# Patient Record
Sex: Male | Born: 1965 | Race: White | Hispanic: No | Marital: Married | Smoking: Never smoker
Health system: Southern US, Community
[De-identification: ages and names within clinical notes are randomized; demographics above are authoritative.]

## PROBLEM LIST (undated history)

## (undated) DIAGNOSIS — I4891 Unspecified atrial fibrillation: Secondary | ICD-10-CM

## (undated) DIAGNOSIS — E7800 Pure hypercholesterolemia, unspecified: Secondary | ICD-10-CM

## (undated) DIAGNOSIS — E119 Type 2 diabetes mellitus without complications: Secondary | ICD-10-CM

## (undated) DIAGNOSIS — I10 Essential (primary) hypertension: Secondary | ICD-10-CM

## (undated) DIAGNOSIS — G4730 Sleep apnea, unspecified: Secondary | ICD-10-CM

## (undated) HISTORY — PX: HERNIA REPAIR: SHX51

---

## 2004-04-27 ENCOUNTER — Emergency Department: Payer: Self-pay | Admitting: Emergency Medicine

## 2004-11-20 ENCOUNTER — Ambulatory Visit: Payer: Self-pay | Admitting: Internal Medicine

## 2006-08-20 ENCOUNTER — Ambulatory Visit: Payer: Self-pay | Admitting: Surgery

## 2006-08-27 ENCOUNTER — Ambulatory Visit: Payer: Self-pay | Admitting: Surgery

## 2014-02-17 DIAGNOSIS — K120 Recurrent oral aphthae: Secondary | ICD-10-CM | POA: Insufficient documentation

## 2014-02-17 DIAGNOSIS — R2230 Localized swelling, mass and lump, unspecified upper limb: Secondary | ICD-10-CM | POA: Insufficient documentation

## 2014-02-17 DIAGNOSIS — J0100 Acute maxillary sinusitis, unspecified: Secondary | ICD-10-CM | POA: Insufficient documentation

## 2015-05-31 ENCOUNTER — Other Ambulatory Visit: Payer: Self-pay | Admitting: Surgery

## 2015-05-31 DIAGNOSIS — R2231 Localized swelling, mass and lump, right upper limb: Secondary | ICD-10-CM

## 2015-06-05 ENCOUNTER — Ambulatory Visit
Admission: RE | Admit: 2015-06-05 | Discharge: 2015-06-05 | Disposition: A | Discharge status: Home / Self Care | Payer: BC Managed Care – PPO | Source: Ambulatory Visit | Attending: Surgery | Admitting: Surgery

## 2015-06-05 DIAGNOSIS — R2231 Localized swelling, mass and lump, right upper limb: Secondary | ICD-10-CM | POA: Insufficient documentation

## 2016-01-30 DIAGNOSIS — Z6836 Body mass index (BMI) 36.0-36.9, adult: Secondary | ICD-10-CM | POA: Insufficient documentation

## 2016-01-30 DIAGNOSIS — E119 Type 2 diabetes mellitus without complications: Secondary | ICD-10-CM | POA: Insufficient documentation

## 2017-02-05 IMAGING — US US EXTREM UP *R* LTD
1 series · 12 of 12 positions shown · non-contrast
Comparison: None.

CLINICAL DATA: Right axillary mass for 8 years has slightly
enlarged recently. No known injury. Initial encounter.

EXAM:
ULTRASOUND RIGHT UPPER EXTREMITY LIMITED
TECHNIQUE: Ultrasound examination of the upper extremity soft tissues was
performed in the area of clinical concern.

[Series 1: us extrem up *right* ltd · 0.07mm/px · 12 of 12 slices shown]
[im 1/12]
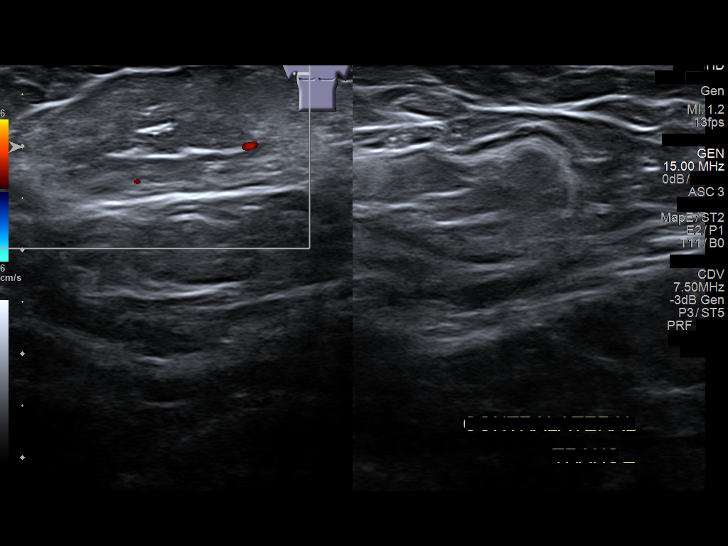
[im 2/12]
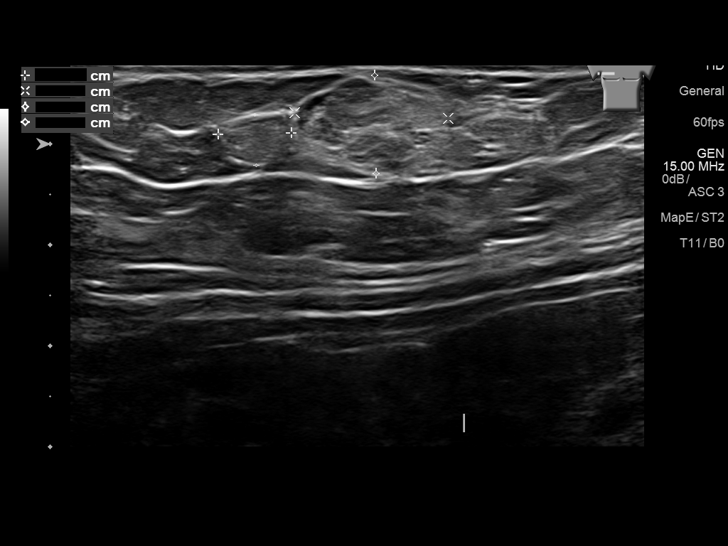
[im 3/12]
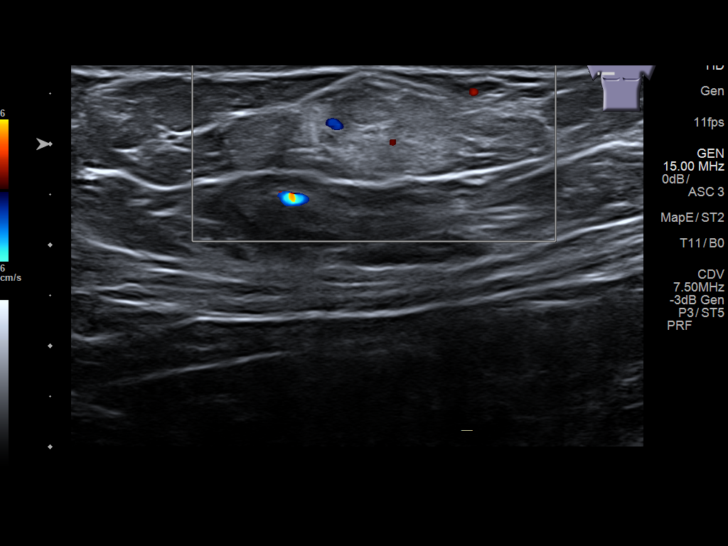
[im 4/12]
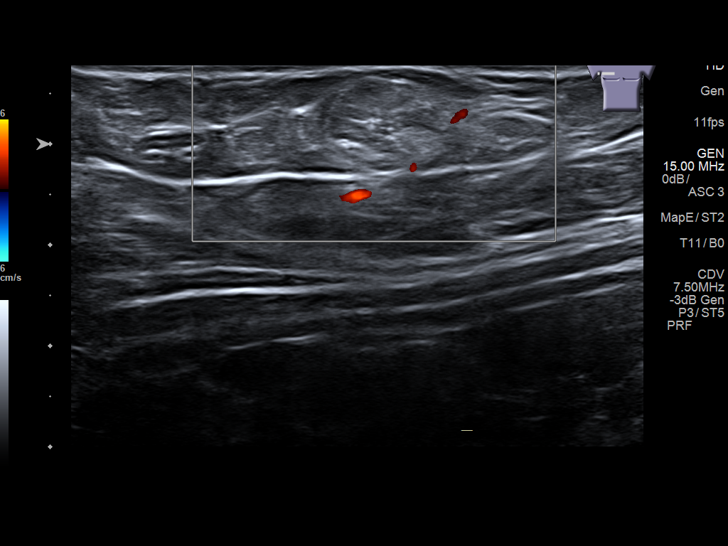
[im 5/12]
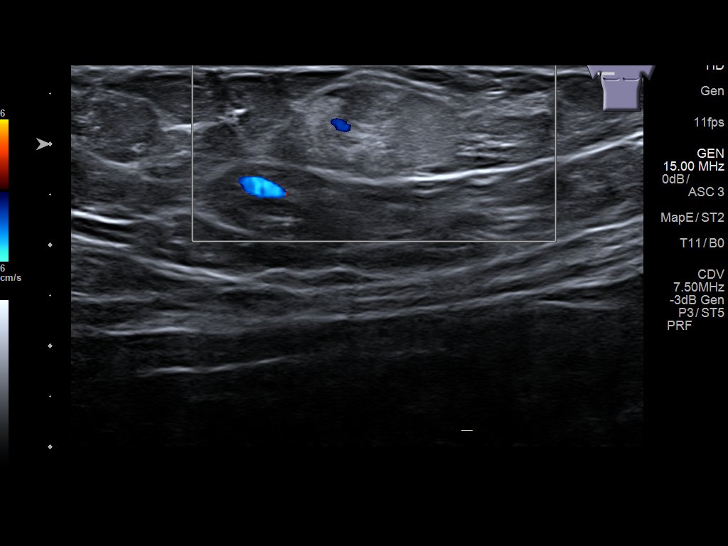
[im 6/12]
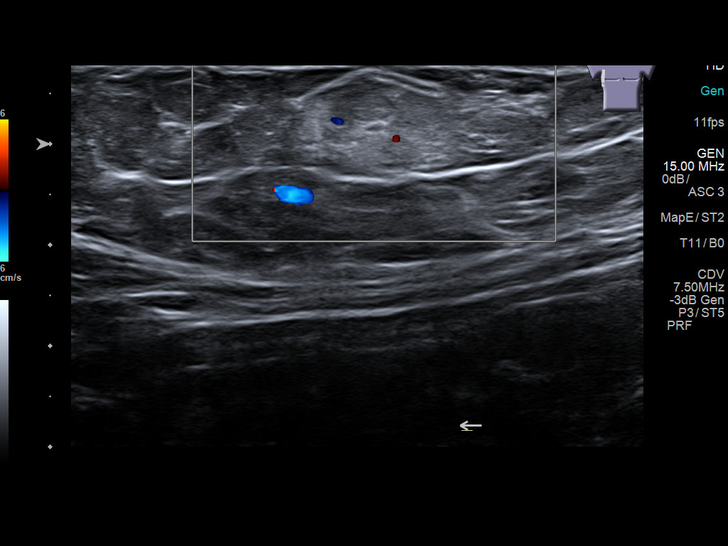
[im 7/12]
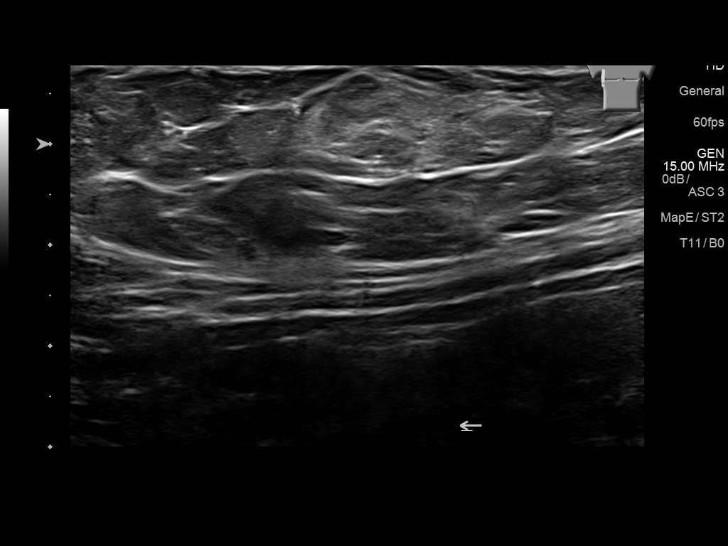
[im 8/12]
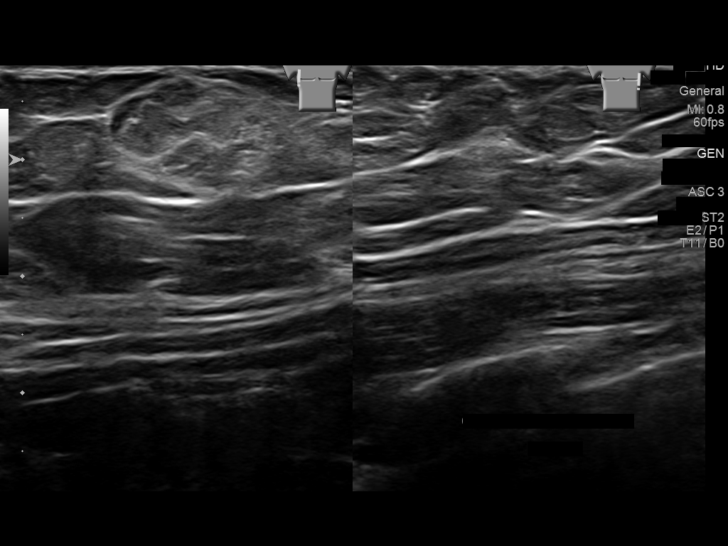
[im 9/12]
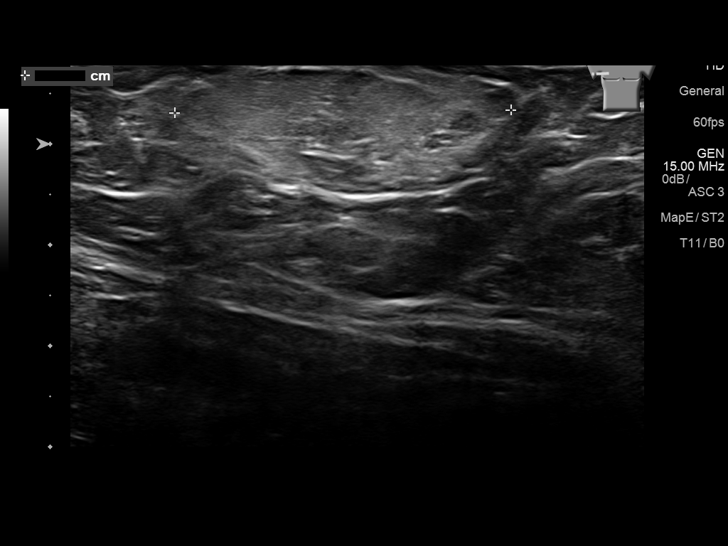
[im 10/12]
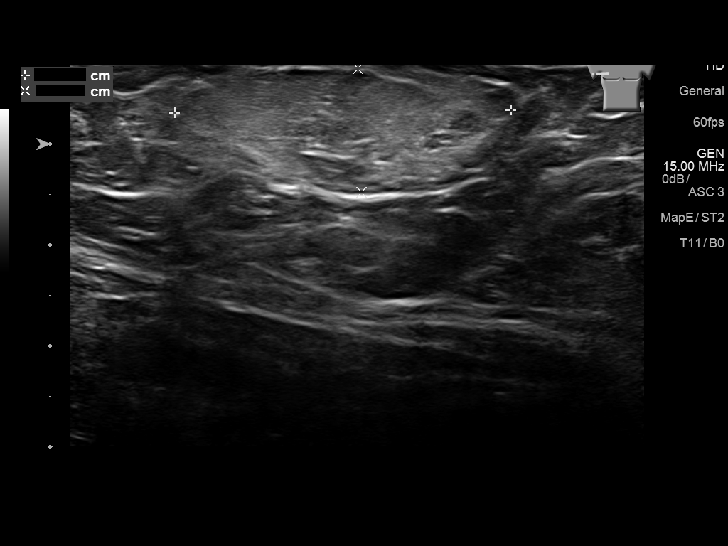
[im 11/12]
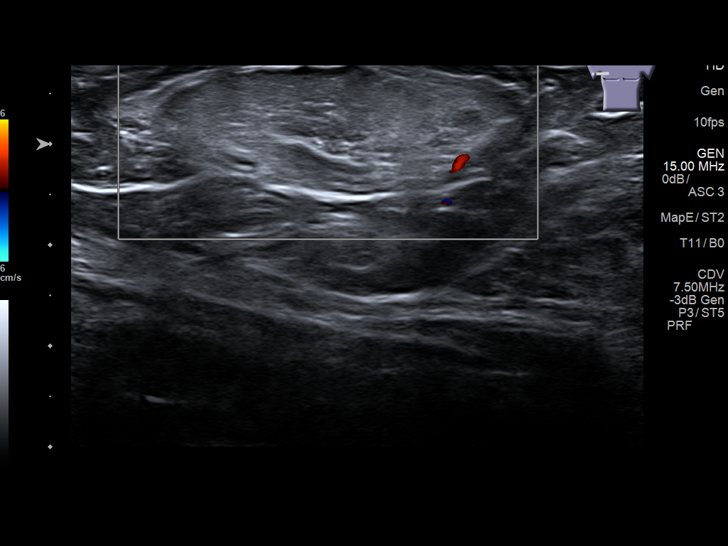
[im 12/12]
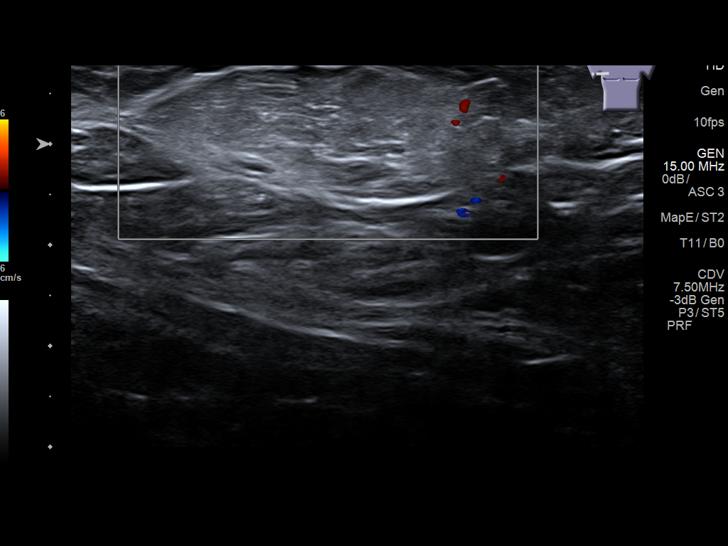

[12 of 12 positions shown; findings below may reference images not displayed]

FINDINGS: Imaging performed in the region of concern on the right axilla
demonstrates a mildly lobulated hyperechoic structure measuring in
total 3.3 x 1.2 x 2.3 cm. Echogenicity is similar to adjacent fat
lobules. No fluid collection is identified. There is some flow
within area of concern on Doppler imaging.
IMPRESSION: Findings most consistent with a simple lipoma in the region of
concern. If the area becomes painful or continues to enlarge, MR
imaging could be used for further evaluation.

## 2019-03-02 ENCOUNTER — Telehealth: Payer: Self-pay | Admitting: Nurse Practitioner

## 2019-03-02 ENCOUNTER — Encounter: Payer: Self-pay | Admitting: Emergency Medicine

## 2019-03-02 ENCOUNTER — Ambulatory Visit
Admission: EM | Admit: 2019-03-02 | Discharge: 2019-03-02 | Disposition: A | Discharge status: Home / Self Care | Payer: BC Managed Care – PPO | Attending: Urgent Care | Admitting: Urgent Care

## 2019-03-02 ENCOUNTER — Other Ambulatory Visit: Payer: Self-pay

## 2019-03-02 DIAGNOSIS — Z20822 Contact with and (suspected) exposure to covid-19: Secondary | ICD-10-CM | POA: Insufficient documentation

## 2019-03-02 DIAGNOSIS — Z7189 Other specified counseling: Secondary | ICD-10-CM | POA: Insufficient documentation

## 2019-03-02 DIAGNOSIS — U071 COVID-19: Secondary | ICD-10-CM

## 2019-03-02 HISTORY — DX: Pure hypercholesterolemia, unspecified: E78.00

## 2019-03-02 LAB — SARS CORONAVIRUS 2 AG (30 MIN TAT): SARS Coronavirus 2 Ag: POSITIVE — AB

## 2019-03-02 NOTE — Discharge Instructions (Addendum)
It was very nice seeing you today in clinic. Thank you for entrusting me with your care.   REST and Stay HYDRATED. Water and electrolyte containing beverages (Gatorade, Pedialyte) are best to prevent dehydration and electrolyte abnormalities.  Im referring you to the COVID infusion clinic. Someone should call you to discuss being treated with infusion to treat COVID.   Make arrangements to follow up with your regular doctor in 1 week for re-evaluation if not improving. If your symptoms/condition worsens, please seek follow up care either here or in the ER. Please remember, our [facility] providers are "right here with you" when you need [place].   Again, it was my pleasure to take care of you today. Thank you for choosing our clinic. I hope that you start to feel better quickly.   Quentin Mulling, MSN, APRN, FNP-C, CEN Advanced Practice Provider [facility]

## 2019-03-02 NOTE — ED Triage Notes (Signed)
Patient had positive exposure to COVID from his father. He is c/o headache, cough and nasal congestion x 5 days.

## 2019-03-02 NOTE — Telephone Encounter (Signed)
Called to Discuss with patient about Covid symptoms and the use of bamlanivimab, a monoclonal antibody infusion for those with mild to moderate Covid symptoms and at a high risk of hospitalization.     Pt is qualified for this infusion at the [facility] infusion center due to co-morbid conditions and/or a member of an at-risk group.    Patient states that he might get infusion through [facility]. He will call us back when he makes a decision.

## 2019-03-02 NOTE — ED Provider Notes (Signed)
[place], [place]   Name: Jason Soto DOB: [birth year] MRN: [id number] CSN: [id number] PCP: [facility]  Arrival date and time:  03/02/19 1027  Chief Complaint:  Cough, Nasal Congestion, and Headache   NOTE: Prior to seeing the patient today, I have reviewed the triage nursing documentation and vital signs. Clinical staff has updated patient's PMH/PSHx, current medication list, and drug allergies/intolerances to ensure comprehensive history available to assist in medical decision making.   History:   HPI: Jason Soto is a 54 y.o. male who presents today with complaints of fatigue, cough, sore throat, and a generalized headache that started approximately 5-6 days ago. Cough has been non-productive with no associated shortness of breath or wheezing. He denies that he has experienced any nausea, vomiting, diarrhea, or abdominal pain. He is eating and drinking well. Patient denies any perceived alterations to his sense of taste or smell. Patient presents out of concerns for his personal health after being exposed to someone who tested positive for SARS-CoV-2 (novel coronavirus) on 02/27/2019. Patient helps to provide primary care services for his elderly step-father who was admitted to [facility] yesterday with SARS-CoV-2. Patient presents with his elderly mother today who also has a similar symptom constellation. He has never been tested for SARS-CoV-2 (novel coronavirus) in the past per his report. Patient has been vaccinated for influenza this season. Despite his symptoms, patient has not taken any over the counter interventions to help improve/relieve his reported symptoms at home.    Past Medical History:  Diagnosis Date  . Hypercholesteremia     Past Surgical History:  Procedure Laterality Date  . HERNIA REPAIR      History reviewed. No pertinent family history.  Social History   Tobacco Use  . Smoking status: Never Smoker  . Smokeless tobacco: Never Used   Substance Use Topics  . Alcohol use: Never  . Drug use: Never    There are no problems to display for this patient.   Home Medications:    Current Meds  Medication Sig  . atorvastatin (LIPITOR) 80 MG tablet Take by mouth.  Marland Kitchen lisinopril (ZESTRIL) 2.5 MG tablet Take by mouth.  . metFORMIN (GLUCOPHAGE-XR) 500 MG 24 hr tablet Take by mouth.    Allergies:   Aspirin and Penicillins  Review of Systems (ROS): Review of Systems  Constitutional: Positive for fatigue. Negative for fever.  HENT: Positive for sore throat. Negative for congestion, ear pain, postnasal drip, rhinorrhea, sinus pressure, sinus pain and sneezing.   Eyes: Negative for pain, discharge and redness.  Respiratory: Positive for chest tightness. Negative for cough and shortness of breath.   Cardiovascular: Negative for chest pain and palpitations.  Gastrointestinal: Negative for abdominal pain, diarrhea, nausea and vomiting.  Musculoskeletal: Negative for arthralgias, back pain, myalgias and neck pain.  Skin: Negative for color change, pallor and rash.  Neurological: Positive for headaches. Negative for dizziness, syncope and weakness.  Hematological: Negative for adenopathy.     Vital Signs: Today's Vitals   03/02/19 1058 03/02/19 1059 03/02/19 1158  BP: (!) 138/94    Pulse: 86    Resp: 18    Temp: 98.6 F (37 C)    TempSrc: Oral    SpO2: 98%    Weight:  198 lb (89.8 kg)   Height:  _0  (1.727 m)   PainSc:  0-No pain 0-No pain    Physical Exam: Physical Exam  Constitutional: He is oriented to person, place, and time and well-developed, well-nourished, and  in no distress.  HENT:  Head: Normocephalic and atraumatic.  Nose: Nose normal.  Mouth/Throat: Uvula is midline and mucous membranes are normal. Posterior oropharyngeal erythema present. No oropharyngeal exudate or posterior oropharyngeal edema.  Eyes: Pupils are equal, round, and reactive to light.  Neck: No tracheal deviation present.    Cardiovascular: Normal rate, regular rhythm, normal heart sounds and intact distal pulses.  Pulmonary/Chest: Effort normal and breath sounds normal.  Musculoskeletal:     Cervical back: Normal range of motion and neck supple.  Lymphadenopathy:    He has no cervical adenopathy.  Neurological: He is alert and oriented to person, place, and time. Gait normal.  Skin: Skin is warm and dry. No rash noted. He is not diaphoretic.  Psychiatric: Mood, memory, affect and judgment normal.  Nursing note and vitals reviewed.   Urgent Care Treatments / Results:   Orders Placed This Encounter  Procedures  . SARS Coronavirus 2 Ag (30 min TAT) - Nasal Swab (BD Veritor Kit)  . Airborne and Contact precautions    LABS: PLEASE NOTE: all labs that were ordered this encounter are listed, however only abnormal results are displayed. Labs Reviewed  SARS CORONAVIRUS 2 AG (30 MIN TAT) - Abnormal; Notable for the following components:      Result Value   SARS Coronavirus 2 Ag POSITIVE (*)    All other components within normal limits    EKG: -None  RADIOLOGY: No results found.  PROCEDURES: Procedures  MEDICATIONS RECEIVED THIS VISIT: Medications - No data to display  PERTINENT CLINICAL COURSE NOTES/UPDATES:   Initial Impression / Assessment and Plan / Urgent Care Course:  Pertinent labs & imaging results that were available during my care of the patient were personally reviewed by me and considered in my medical decision making (see lab/imaging section of note for values and interpretations).  Jason Soto is a 54 y.o. male who presents to [facility] today with complaints of Cough, Nasal Congestion, and Headache  Patient overall well appearing and in no acute distress today in clinic. Presenting symptoms (see HPI) and exam as documented above. He presents with symptoms associated with SARS-CoV-2 (novel coronavirus) follow a direct exposure. Discussed typical symptom constellation.  Reviewed potential for infection and need for testing. Patient amenable to being tested.   Rapid SARS-CoV-2 Ag swab collected by certified clinical staff; results POSITIVE.   Discussed results with patient. Patient has multiple co-morbidities that increase his SARS-CoV-2 morbidity and mortality risk including HTN and T2DM. I discussed with him that his symptoms are viral in nature, thus antibiotics would not offer him any relief or improve his symptoms any faster than conservative symptomatic management.    In light of his positive results, his co-morbidities should qualify him for treatment with the monoclonal antibody (bamlanivimab) infusion. Referral made to the outpatient COVID response team to determine eligibility. Patient advised to expect a call from [facility] to discuss if he is deemed to be eligible for the infusion treatment.   In the interim, discussed supportive care measures at home during acute phase of illness. Patient to rest as much as possible. He was encouraged to ensure adequate hydration (water and ORS) to prevent dehydration and electrolyte derangements. Recommended warm salt water gargles, hard candies/lozenges, and hot tea with honey/lemon to help soothe the throat and reduce irritation. May use Tylenol and/or Ibuprofen as needed for pain/fever.    Discussed follow up with primary care physician in 1 week for re-evaluation. I have reviewed the follow  up and strict return precautions for any new or worsening symptoms. Patient is aware of symptoms that would be deemed urgent/emergent, and would thus require further evaluation either here or in the emergency department. At the time of discharge, he verbalized understanding and consent with the discharge plan as it was reviewed with him. All questions were fielded by provider and/or clinic staff prior to patient discharge.    Final Clinical Impressions / Urgent Care Diagnoses:   Final diagnoses:  [id number]  Encounter for  laboratory testing for COVID-19 virus  Advice given about COVID-19 virus infection    New Prescriptions:  [place] Controlled Substance Registry consulted? Not Applicable  No orders of the defined types were placed in this encounter.   Recommended Follow up Care:  Patient encouraged to follow up with the following provider within the specified time frame, or sooner as dictated by the severity of his symptoms. As always, he was instructed that for any urgent/emergent care needs, he should seek care either here or in the emergency department for more immediate evaluation.  Follow-up Information    [place], [facility] In 1 week.   Specialty: Family Medicine Why: General reassessment of symptoms if not improving Contact information: [address] [place] [id number] [phone]         NOTE: This note was prepared using Dragon dictation software along with smaller phrase technology. Despite my best ability to proofread, there is the potential that transcriptional errors may still occur from this process, and are completely unintentional.    Karen Kitchens, NP 03/02/19 1210

## 2021-02-07 ENCOUNTER — Ambulatory Visit
Admission: RE | Admit: 2021-02-07 | Discharge: 2021-02-07 | Disposition: A | Discharge status: Home / Self Care | Payer: BC Managed Care – PPO | Attending: Internal Medicine | Admitting: Internal Medicine

## 2021-02-07 ENCOUNTER — Encounter
Admission: RE | Disposition: A | Discharge status: Home / Self Care | Payer: Self-pay | Source: Home / Self Care | Attending: Internal Medicine

## 2021-02-07 ENCOUNTER — Encounter: Payer: Self-pay | Admitting: Internal Medicine

## 2021-02-07 ENCOUNTER — Ambulatory Visit: Payer: BC Managed Care – PPO | Admitting: Registered Nurse

## 2021-02-07 ENCOUNTER — Other Ambulatory Visit: Payer: Self-pay

## 2021-02-07 DIAGNOSIS — E785 Hyperlipidemia, unspecified: Secondary | ICD-10-CM | POA: Diagnosis not present

## 2021-02-07 DIAGNOSIS — Z7901 Long term (current) use of anticoagulants: Secondary | ICD-10-CM | POA: Insufficient documentation

## 2021-02-07 DIAGNOSIS — E119 Type 2 diabetes mellitus without complications: Secondary | ICD-10-CM | POA: Diagnosis not present

## 2021-02-07 DIAGNOSIS — Z79899 Other long term (current) drug therapy: Secondary | ICD-10-CM | POA: Insufficient documentation

## 2021-02-07 DIAGNOSIS — I480 Paroxysmal atrial fibrillation: Secondary | ICD-10-CM | POA: Insufficient documentation

## 2021-02-07 DIAGNOSIS — I10 Essential (primary) hypertension: Secondary | ICD-10-CM | POA: Insufficient documentation

## 2021-02-07 HISTORY — PX: CARDIOVERSION: SHX1299

## 2021-02-07 HISTORY — DX: Unspecified atrial fibrillation: I48.91

## 2021-02-07 LAB — GLUCOSE, CAPILLARY: Glucose-Capillary: 248 mg/dL — ABNORMAL HIGH (ref 70–99)

## 2021-02-07 SURGERY — CARDIOVERSION
Anesthesia: General

## 2021-02-07 MED ORDER — SODIUM CHLORIDE 0.9 % IV SOLN: INTRAVENOUS | Status: DC

## 2021-02-07 MED ORDER — PROPOFOL 10 MG/ML IV BOLUS
INTRAVENOUS | Status: DC | PRN
  Administered 2021-02-07: 40 mg via INTRAVENOUS
  Administered 2021-02-07: 50 mg via INTRAVENOUS

## 2021-02-07 MED ORDER — APIXABAN 5 MG PO TABS
5.0000 mg | ORAL_TABLET | Freq: Once | ORAL | Status: AC
  Administered 2021-02-07: 5 mg via ORAL
  Filled 2021-02-07: qty 1

## 2021-02-07 NOTE — Anesthesia Preprocedure Evaluation (Addendum)
Anesthesia Evaluation  Patient identified by MRN, date of birth, ID band Patient awake    Reviewed: Allergy & Precautions, NPO status , Patient's Chart, lab work & pertinent test results, reviewed documented beta blocker date and time   Airway Mallampati: III  TM Distance: >3 FB Neck ROM: Full    Dental no notable dental hx.    Pulmonary neg pulmonary ROS,    Pulmonary exam normal breath sounds clear to auscultation       Cardiovascular Exercise Tolerance: Good hypertension, Pt. on medications and Pt. on home beta blockers Normal cardiovascular exam+ dysrhythmias Atrial Fibrillation  Rhythm:Irregular Rate:Normal     Neuro/Psych negative neurological ROS  negative psych ROS   GI/Hepatic negative GI ROS, Neg liver ROS,   Endo/Other  negative endocrine ROS  Renal/GU negative Renal ROS  negative genitourinary   Musculoskeletal negative musculoskeletal ROS (+)   Abdominal (+) + obese,   Peds negative pediatric ROS (+)  Hematology negative hematology ROS (+)   Anesthesia Other Findings   Reproductive/Obstetrics negative OB ROS                            Anesthesia Physical Anesthesia Plan  ASA: 3  Anesthesia Plan: General   Post-op Pain Management:    Induction: Intravenous  PONV Risk Score and Plan: TIVA and Treatment may vary due to age or medical condition  Airway Management Planned: Natural Airway and Nasal Cannula  Additional Equipment:   Intra-op Plan:   Post-operative Plan:   Informed Consent: I have reviewed the patients History and Physical, chart, labs and discussed the procedure including the risks, benefits and alternatives for the proposed anesthesia with the patient or authorized representative who has indicated his/her understanding and acceptance.     Dental Advisory Given  Plan Discussed with:   Anesthesia Plan Comments:         Anesthesia Quick  Evaluation

## 2021-02-07 NOTE — CV Procedure (Signed)
Electrical Cardioversion Procedure Note Jason Soto [id number] 04-18-1965  Procedure: Electrical Cardioversion Indications:  Paroxysmal non valvular atrial fibrillation  Procedure Details Consent: Risks of procedure as well as the alternatives and risks of each were explained to the (patient/caregiver).  Consent for procedure obtained. Time Out: Verified patient identification, verified procedure, site/side was marked, verified correct patient position, special equipment/implants available, medications/allergies/relevent history reviewed, required imaging and test results available.  Performed  Patient placed on cardiac monitor, pulse oximetry, supplemental oxygen as necessary.  Sedation given: Propofol and versed as per anesthesia  Pacer pads placed anterior and posterior chest.  Cardioverted 2 time(s).  Cardioverted at 150J.  Evaluation Findings: Post procedure EKG shows: NSR Complications: None Patient did tolerate procedure well.   Arnoldo Hooker M.D. [facility] 02/07/2021, 8:04 AM

## 2021-02-07 NOTE — Transfer of Care (Signed)
Immediate Anesthesia Transfer of Care Note  Patient: Jason Soto  Procedure(s) Performed: CARDIOVERSION  Patient Location: Special Procedures  Anesthesia Type:General  Level of Consciousness: drowsy  Airway & Oxygen Therapy: Patient Spontanous Breathing and Patient connected to nasal cannula oxygen  Post-op Assessment: Report given to RN and Post -op Vital signs reviewed and stable  Post vital signs: Reviewed and stable  Last Vitals:  Vitals Value Taken Time  BP 95/75 02/07/21 0757  Temp    Pulse 53 02/07/21 0759  Resp 16 02/07/21 0759  SpO2 97 % 02/07/21 0759    Last Pain:  Vitals:   02/07/21 0722  TempSrc: Oral  PainSc: 0-No pain         Complications: No notable events documented.

## 2021-02-08 NOTE — Anesthesia Postprocedure Evaluation (Addendum)
Anesthesia Post Note  Patient: Jason Soto  Procedure(s) Performed: CARDIOVERSION  Patient location during evaluation: PACU Anesthesia Type: General Level of consciousness: combative Pain management: pain level controlled Vital Signs Assessment: post-procedure vital signs reviewed and stable Respiratory status: spontaneous breathing, nonlabored ventilation and respiratory function stable Cardiovascular status: blood pressure returned to baseline and stable Postop Assessment: no apparent nausea or vomiting Anesthetic complications: no   No notable events documented.   Last Vitals:  Vitals:   02/07/21 0830 02/07/21 0845  BP: 92/65 93/73  Pulse: (!) 51 (!) 51  Resp: 15 14  Temp:    SpO2: 96% 94%    Last Pain:  Vitals:   02/07/21 0845  TempSrc:   PainSc: 0-No pain                 Foye Deer

## 2022-03-06 ENCOUNTER — Other Ambulatory Visit: Payer: Self-pay

## 2022-03-06 ENCOUNTER — Telehealth: Payer: Self-pay

## 2022-03-06 DIAGNOSIS — Z1211 Encounter for screening for malignant neoplasm of colon: Secondary | ICD-10-CM

## 2022-03-06 MED ORDER — NA SULFATE-K SULFATE-MG SULF 17.5-3.13-1.6 GM/177ML PO SOLN: 1.0000 | Freq: Once | ORAL | 0 refills | Status: AC

## 2022-03-06 NOTE — Telephone Encounter (Signed)
Gastroenterology Pre-Procedure Review  Request Date: 05/28/22 Requesting Physician: Dr. Allen Norris  PATIENT REVIEW QUESTIONS: The patient responded to the following health history questions as indicated:    1. Are you having any GI issues? no 2. Do you have a personal history of Polyps? no 3. Do you have a family history of Colon Cancer or Polyps? no 4. Diabetes Mellitus?  Prediabetic takes metformin and glipizide 5. Joint replacements in the past 12 months?no 6. Major health problems in the past 3 months?no 7. Any artificial heart valves, MVP, or defibrillator? Cardiac history Afib. Clearance sent to [facility]    MEDICATIONS & ALLERGIES:    Patient reports the following regarding taking any anticoagulation/antiplatelet therapy:   Plavix, Coumadin, Eliquis, Xarelto, Lovenox, Pradaxa, Brilinta, or Effient? Eliquis overseen by [facility] Advice letter to be sent  Aspirin? no  Patient confirms/reports the following medications:  Current Outpatient Medications  Medication Sig Dispense Refill   albuterol (VENTOLIN HFA) 108 (90 Base) MCG/ACT inhaler Inhale 2 puffs into the lungs every 6 (six) hours as needed for wheezing or shortness of breath.     apixaban (ELIQUIS) 5 MG TABS tablet Take 5 mg by mouth 2 (two) times daily.     atorvastatin (LIPITOR) 80 MG tablet Take 80 mg by mouth daily.     flecainide (TAMBOCOR) 100 MG tablet Take 100 mg by mouth 2 (two) times daily.     glipiZIDE (GLUCOTROL XL) 5 MG 24 hr tablet Take 5 mg by mouth daily with breakfast.     lisinopril (ZESTRIL) 2.5 MG tablet Take 2.5 mg by mouth daily.     metFORMIN (GLUCOPHAGE-XR) 500 MG 24 hr tablet Take 1,000 mg by mouth in the morning and at bedtime.     metoprolol tartrate (LOPRESSOR) 100 MG tablet Take 100 mg by mouth 2 (two) times daily.     No current facility-administered medications for this visit.    Patient confirms/reports the following allergies:  Allergies  Allergen Reactions   Aspirin Swelling     Other reaction(s): SWELLING Other reaction(s): SWELLING    Ibuprofen Swelling    Lip swelling    Penicillins Other (See Comments)    Other. Avoids due to his brother having had an anaphylactic reaction. Other. Avoids due to his brother having had an anaphylactic reaction.     No orders of the defined types were placed in this encounter.   AUTHORIZATION INFORMATION Primary Insurance: 1D#: Group #:  Secondary Insurance: 1D#: Group #:  SCHEDULE INFORMATION: Date: 05/28/22 Time: Location: [facility]

## 2022-03-14 ENCOUNTER — Telehealth: Payer: Self-pay

## 2022-03-14 NOTE — Telephone Encounter (Signed)
Patient has been advised that he has been cleared to have his colonoscopy as scheduled for 05/28/22.  Stop Eliquis 3 days before procedure and restart 1 day after.    Pt verbalized understanding and said he will make note of this.  Cardiac Clearance and Blood Thinner advice completed by PAC Leroy Libman at [facility] on 03/06/22.  Thanks, Sharyn Lull CMA

## 2022-05-27 ENCOUNTER — Encounter: Payer: Self-pay | Admitting: Gastroenterology

## 2022-05-27 ENCOUNTER — Telehealth: Payer: Self-pay

## 2022-05-27 NOTE — Telephone Encounter (Signed)
Patient has been informed that Dr. Servando Snare would not be available to do his colonoscopy as orginally scheduled.  He has been moved to Dr. Johnney Killian schedule.  Thanks, [place], [place]

## 2022-05-28 ENCOUNTER — Ambulatory Visit: Payer: BC Managed Care – PPO | Admitting: Certified Registered"

## 2022-05-28 ENCOUNTER — Encounter: Payer: Self-pay | Admitting: Gastroenterology

## 2022-05-28 ENCOUNTER — Other Ambulatory Visit: Payer: Self-pay

## 2022-05-28 ENCOUNTER — Ambulatory Visit
Admission: RE | Admit: 2022-05-28 | Discharge: 2022-05-28 | Disposition: A | Discharge status: Home / Self Care | Payer: BC Managed Care – PPO | Attending: Gastroenterology | Admitting: Gastroenterology

## 2022-05-28 ENCOUNTER — Encounter
Admission: RE | Disposition: A | Discharge status: Home / Self Care | Payer: Self-pay | Source: Home / Self Care | Attending: Gastroenterology

## 2022-05-28 DIAGNOSIS — E119 Type 2 diabetes mellitus without complications: Secondary | ICD-10-CM | POA: Insufficient documentation

## 2022-05-28 DIAGNOSIS — G4730 Sleep apnea, unspecified: Secondary | ICD-10-CM | POA: Insufficient documentation

## 2022-05-28 DIAGNOSIS — I4891 Unspecified atrial fibrillation: Secondary | ICD-10-CM | POA: Insufficient documentation

## 2022-05-28 DIAGNOSIS — Z1211 Encounter for screening for malignant neoplasm of colon: Secondary | ICD-10-CM

## 2022-05-28 DIAGNOSIS — D126 Benign neoplasm of colon, unspecified: Secondary | ICD-10-CM

## 2022-05-28 DIAGNOSIS — E7800 Pure hypercholesterolemia, unspecified: Secondary | ICD-10-CM | POA: Insufficient documentation

## 2022-05-28 DIAGNOSIS — I10 Essential (primary) hypertension: Secondary | ICD-10-CM | POA: Insufficient documentation

## 2022-05-28 DIAGNOSIS — D120 Benign neoplasm of cecum: Secondary | ICD-10-CM | POA: Diagnosis not present

## 2022-05-28 HISTORY — DX: Essential (primary) hypertension: I10

## 2022-05-28 HISTORY — DX: Type 2 diabetes mellitus without complications: E11.9

## 2022-05-28 HISTORY — PX: COLONOSCOPY WITH PROPOFOL: SHX5780

## 2022-05-28 HISTORY — DX: Sleep apnea, unspecified: G47.30

## 2022-05-28 LAB — GLUCOSE, CAPILLARY: Glucose-Capillary: 135 mg/dL — ABNORMAL HIGH (ref 70–99)

## 2022-05-28 SURGERY — COLONOSCOPY WITH PROPOFOL
Anesthesia: General

## 2022-05-28 MED ORDER — SODIUM CHLORIDE 0.9 % IV SOLN: INTRAVENOUS | Status: DC

## 2022-05-28 MED ORDER — PROPOFOL 500 MG/50ML IV EMUL
INTRAVENOUS | Status: DC | PRN
  Administered 2022-05-28: 165 ug/kg/min via INTRAVENOUS

## 2022-05-28 MED ORDER — METOPROLOL TARTRATE 5 MG/5ML IV SOLN
INTRAVENOUS | Status: DC | PRN
  Administered 2022-05-28: 2 mg via INTRAVENOUS
  Administered 2022-05-28: 3 mg via INTRAVENOUS
  Administered 2022-05-28 (×2): 2 mg via INTRAVENOUS
  Administered 2022-05-28: 1 mg via INTRAVENOUS

## 2022-05-28 MED ORDER — STERILE WATER FOR IRRIGATION IR SOLN
Status: DC | PRN
  Administered 2022-05-28: 60 mL

## 2022-05-28 MED ORDER — SODIUM CHLORIDE (PF) 0.9 % IJ SOLN
INTRAMUSCULAR | Status: DC | PRN
  Administered 2022-05-28: 5 mL via INTRAVENOUS

## 2022-05-28 MED ORDER — DILTIAZEM HCL 25 MG/5ML IV SOLN
12.5000 mg | INTRAVENOUS | Status: AC | PRN
  Administered 2022-05-28 (×2): 12.5 mg via INTRAVENOUS
  Filled 2022-05-28 (×4): qty 5

## 2022-05-28 MED ORDER — DEXMEDETOMIDINE HCL IN NACL 200 MCG/50ML IV SOLN
INTRAVENOUS | Status: DC | PRN
  Administered 2022-05-28: 8 ug via INTRAVENOUS

## 2022-05-28 MED ORDER — PROPOFOL 10 MG/ML IV BOLUS
INTRAVENOUS | Status: DC | PRN
  Administered 2022-05-28: 70 mg via INTRAVENOUS
  Administered 2022-05-28: 10 mg via INTRAVENOUS

## 2022-05-28 MED ORDER — ESMOLOL HCL 100 MG/10ML IV SOLN
INTRAVENOUS | Status: DC | PRN
  Administered 2022-05-28: 10 mg via INTRAVENOUS
  Administered 2022-05-28: 20 mg via INTRAVENOUS

## 2022-05-28 MED ORDER — LIDOCAINE HCL (CARDIAC) PF 100 MG/5ML IV SOSY
PREFILLED_SYRINGE | INTRAVENOUS | Status: DC | PRN
  Administered 2022-05-28: 100 mg via INTRAVENOUS

## 2022-05-28 NOTE — OR Nursing (Signed)
Pt in A-flutter, last BP 106/84, 2nd dose of Cardizem given at 1132, HR 80's-100's; ok to discharge per Dr. Marnee Spring. Pt to start Eliquis back up today per Dr. Tobi Bastos.

## 2022-05-28 NOTE — Progress Notes (Signed)
Patient continues to be in rapid Afib , heart rate in 150s, despite 10mg  metoprolol IV and 30mg  esmolol IV.  Patient with normal BP; asymptomatic, denies chest pain, headache, dizziness, palpitations. Known chronic afib.  Will administer diltiazem up to two doses.

## 2022-05-28 NOTE — H&P (Signed)
Wyline Mood, MD [address], [address], [place], [place], [address] [address], [address], [place], [place], [address] Phone: [phone]  Fax: [phone]  Primary Care Physician:  Kennis Carina, MD   Pre-Procedure History & Physical: HPI:  Jason Soto is a 57 y.o. male is here for an colonoscopy.   Past Medical History:  Diagnosis Date   Atrial fibrillation (HCC)    Diabetes mellitus without complication (HCC)    Hypercholesteremia    Hypertension    Sleep apnea     Past Surgical History:  Procedure Laterality Date   CARDIOVERSION N/A 02/07/2021   Procedure: CARDIOVERSION;  Surgeon: Lamar Blinks, MD;  Location: [facility] ORS;  Service: Cardiovascular;  Laterality: N/A;   HERNIA REPAIR      Prior to Admission medications   Medication Sig Start Date End Date Taking? Authorizing Provider  albuterol (VENTOLIN HFA) 108 (90 Base) MCG/ACT inhaler Inhale 2 puffs into the lungs every 6 (six) hours as needed for wheezing or shortness of breath.    [provider]  apixaban (ELIQUIS) 5 MG TABS tablet Take 5 mg by mouth 2 (two) times daily.    [provider]  atorvastatin (LIPITOR) 80 MG tablet Take 80 mg by mouth daily. 10/16/18   [provider]  flecainide (TAMBOCOR) 100 MG tablet Take 100 mg by mouth 2 (two) times daily.    [provider]  glipiZIDE (GLUCOTROL XL) 5 MG 24 hr tablet Take 5 mg by mouth daily with breakfast.    [provider]  lisinopril (ZESTRIL) 2.5 MG tablet Take 2.5 mg by mouth daily. 02/23/19   [provider]  metFORMIN (GLUCOPHAGE-XR) 500 MG 24 hr tablet Take 1,000 mg by mouth in the morning and at bedtime. 10/16/18   [provider]  metoprolol tartrate (LOPRESSOR) 100 MG tablet Take 100 mg by mouth 2 (two) times daily.    [provider]    Allergies as of 03/06/2022 - Review Complete 02/07/2021  Allergen Reaction Noted   Aspirin Swelling 11/19/2012   Ibuprofen Swelling  01/31/2021   Penicillins Other (See Comments) 11/19/2012    History reviewed. No pertinent family history.  Social History   Socioeconomic History   Marital status: Married    Spouse name: Not on file   Number of children: Not on file   Years of education: Not on file   Highest education level: Not on file  Occupational History   Not on file  Tobacco Use   Smoking status: Never   Smokeless tobacco: Never  Vaping Use   Vaping Use: Never used  Substance and Sexual Activity   Alcohol use: Yes    Comment: rarley   Drug use: Never   Sexual activity: Not on file  Other Topics Concern   Not on file  Social History Narrative   Not on file   Social Determinants of Health   Financial Resource Strain: Not on file  Food Insecurity: Not on file  Transportation Needs: Not on file  Physical Activity: Not on file  Stress: Not on file  Social Connections: Not on file  Intimate Partner Violence: Not on file    Review of Systems: See HPI, otherwise negative ROS  Physical Exam: There were no vitals taken for this visit. General:   Alert,  pleasant and cooperative in NAD Head:  Normocephalic and atraumatic. Neck:  Supple; no masses or thyromegaly. Lungs:  Clear throughout to auscultation, normal respiratory effort.  Heart:  +S1, +S2, Regular rate and rhythm, No edema. Abdomen:  Soft, nontender and nondistended. Normal bowel sounds, without guarding, and without rebound.   Neurologic:  Alert and  oriented x4;  grossly normal neurologically.  Impression/Plan: Jason Soto is here for an colonoscopy to be performed for Screening colonoscopy average risk   Risks, benefits, limitations, and alternatives regarding  colonoscopy have been reviewed with the patient.  Questions have been answered.  All parties agreeable.   Wyline Mood, MD  05/28/2022, 9:31 AM

## 2022-05-28 NOTE — Anesthesia Procedure Notes (Signed)
Procedure Name: General with mask airway Date/Time: 05/28/2022 10:12 AM  Performed by: Mohammed Kindle, CRNAPre-anesthesia Checklist: Patient identified, Emergency Drugs available, Suction available and Patient being monitored Patient Re-evaluated:Patient Re-evaluated prior to induction Oxygen Delivery Method: Simple face mask Induction Type: IV induction Placement Confirmation: positive ETCO2, CO2 detector and breath sounds checked- equal and bilateral Dental Injury: Teeth and Oropharynx as per pre-operative assessment

## 2022-05-28 NOTE — Op Note (Signed)
[facility] Gastroenterology Patient Name: Jason Soto Procedure Date: 05/28/2022 10:01 AM MRN: [id number] Account #: [id number] Date of Birth: Sep 05, 1965 Admit Type: Outpatient Age: 57 Room: [facility] ENDO ROOM 2 Gender: Male Note Status: Finalized Instrument Name: Prentice Docker [id number] Procedure:             Colonoscopy Indications:           Screening for colorectal malignant neoplasm Providers:             Wyline Mood MD, MD Referring MD:          Midge Minium MD, MD (Referring MD), No Local Md, MD                         (Referring MD) Medicines:             Monitored Anesthesia Care Complications:         No immediate complications. Procedure:             Pre-Anesthesia Assessment:                        - Prior to the procedure, a History and Physical was                         performed, and patient medications, allergies and                         sensitivities were reviewed. The patient's tolerance                         of previous anesthesia was reviewed.                        - The risks and benefits of the procedure and the                         sedation options and risks were discussed with the                         patient. All questions were answered and informed                         consent was obtained.                        - ASA Grade Assessment: II - A patient with mild                         systemic disease.                        After obtaining informed consent, the colonoscope was                         passed under direct vision. Throughout the procedure,                         the patient's blood pressure, pulse, and oxygen                         saturations  were monitored continuously. The                         Colonoscope was introduced through the anus and                         advanced to the the cecum, identified by the                         appendiceal orifice. The colonoscopy was performed                          with ease. The patient tolerated the procedure well.                         The quality of the bowel preparation was excellent.                         The ileocecal valve, appendiceal orifice, and rectum                         were photographed. Findings:      The perianal and digital rectal examinations were normal.      A 15 mm polyp was found in the cecum. The polyp was sessile.       Preparations were made for mucosal resection. Demarcation of the lesion       was performed with narrow band imaging to clearly identify the       boundaries of the lesion. Saline was injected to raise the lesion. Snare       mucosal resection was performed. Resection and retrieval were complete.       Resected tissue margins were examined and clear of polyp tissue. To       close a defect after mucosal resection, one hemostatic clip was       successfully placed. There was no bleeding at the end of the procedure.      The exam was otherwise without abnormality on direct and retroflexion       views. Impression:            - One 15 mm polyp in the cecum, removed with mucosal                         resection. Resected and retrieved. Clip was placed.                        - The examination was otherwise normal on direct and                         retroflexion views.                        - Mucosal resection was performed. Resection and                         retrieval were complete. Recommendation:        - Discharge patient to home (with escort).                        - Resume previous diet.                        -  Continue present medications.                        - Await pathology results.                        - Repeat colonoscopy for surveillance based on                         pathology results. Procedure Code(s):     --- Professional ---                        [phone], Colonoscopy, flexible; with endoscopic mucosal                         resection Diagnosis Code(s):     ---  Professional ---                        Z12.11, Encounter for screening for malignant neoplasm                         of colon                        D12.0, Benign neoplasm of cecum CPT copyright 2022 American [employer] Association. All rights reserved. The codes documented in this report are preliminary and upon coder review may  be revised to meet current compliance requirements. Wyline Mood, MD Wyline Mood MD, MD 05/28/2022 10:30:00 AM This report has been signed electronically. Number of Addenda: 0 Note Initiated On: 05/28/2022 10:01 AM Scope Withdrawal Time: 0 hours 11 minutes 49 seconds  Total Procedure Duration: 0 hours 14 minutes 41 seconds  Estimated Blood Loss:  Estimated blood loss: none.      [facility]

## 2022-05-28 NOTE — Anesthesia Postprocedure Evaluation (Signed)
Anesthesia Post Note  Patient: Jason Soto  Procedure(s) Performed: COLONOSCOPY WITH PROPOFOL  Patient location during evaluation: Endoscopy Anesthesia Type: General Level of consciousness: awake and alert Pain management: pain level controlled Vital Signs Assessment: post-procedure vital signs reviewed and stable Respiratory status: spontaneous breathing, nonlabored ventilation, respiratory function stable and patient connected to nasal cannula oxygen Cardiovascular status: blood pressure returned to baseline and stable (s/p two diltiazem IV pushes, with improvement in HR. Normal BP. Patient asymptomatic) Postop Assessment: no apparent nausea or vomiting Anesthetic complications: no   No notable events documented.   Last Vitals:  Vitals:   05/28/22 1131 05/28/22 1135  BP: (!) 109/94 (!) 92/56  Pulse:    Resp:    Temp:    SpO2:      Last Pain:  Vitals:   05/28/22 1135  TempSrc:   PainSc: 0-No pain                 Corinda Gubler

## 2022-05-28 NOTE — OR Nursing (Signed)
1132 CARDIZEM 12.5 MG GIVEN IVP AGAIN. HEART RATE 149.BP 109/94. REMAINS ASX. PT REMAINS IN AFIB/FLUTTER

## 2022-05-28 NOTE — Transfer of Care (Signed)
Immediate Anesthesia Transfer of Care Note  Patient: Jason Soto  Procedure(s) Performed: COLONOSCOPY WITH PROPOFOL  Patient Location: PACU  Anesthesia Type:General  Level of Consciousness: awake, drowsy, and patient cooperative  Airway & Oxygen Therapy: Patient Spontanous Breathing and Patient connected to face mask oxygen  Post-op Assessment: Report given to RN and Post -op Vital signs reviewed and stable  Post vital signs: Reviewed and stable  Last Vitals:  Vitals Value Taken Time  BP 106/78 05/28/22 1032  Temp 35.6 C 05/28/22 1031  Pulse 71 05/28/22 1036  Resp 0 05/28/22 1036  SpO2 100 % 05/28/22 1036  Vitals shown include unvalidated device data.  Last Pain:  Vitals:   05/28/22 1031  TempSrc: Temporal  PainSc: 0-No pain         Complications: No notable events documented.

## 2022-05-28 NOTE — Anesthesia Procedure Notes (Deleted)
Procedure Name: General with mask airway Date/Time: 05/28/2022 10:48 AM  Performed by: Mohammed Kindle, CRNAPre-anesthesia Checklist: Patient identified, Emergency Drugs available, Suction available and Patient being monitored Oxygen Delivery Method: Simple face mask Induction Type: IV induction Placement Confirmation: positive ETCO2, CO2 detector and breath sounds checked- equal and bilateral Dental Injury: Teeth and Oropharynx as per pre-operative assessment

## 2022-05-28 NOTE — Anesthesia Preprocedure Evaluation (Signed)
Anesthesia Evaluation  Patient identified by MRN, date of birth, ID band Patient awake    Reviewed: Allergy & Precautions, NPO status , Patient's Chart, lab work & pertinent test results  History of Anesthesia Complications Negative for: history of anesthetic complications  Airway Mallampati: II  TM Distance: >3 FB Neck ROM: Full    Dental no notable dental hx. (+) Teeth Intact   Pulmonary sleep apnea and Continuous Positive Airway Pressure Ventilation , neg COPD, Patient abstained from smoking.Not current smoker   Pulmonary exam normal breath sounds clear to auscultation       Cardiovascular Exercise Tolerance: Good METShypertension, Pt. on medications (-) CAD and (-) Past MI + dysrhythmias Atrial Fibrillation  Rhythm:Irregular Rate:Normal - Systolic murmurs    Neuro/Psych negative neurological ROS  negative psych ROS   GI/Hepatic ,neg GERD  ,,(+)     (-) substance abuse    Endo/Other  diabetes    Renal/GU negative Renal ROS     Musculoskeletal   Abdominal   Peds  Hematology   Anesthesia Other Findings Past Medical History: No date: Atrial fibrillation (HCC) No date: Diabetes mellitus without complication (HCC) No date: Hypercholesteremia No date: Hypertension No date: Sleep apnea  Reproductive/Obstetrics                             Anesthesia Physical Anesthesia Plan  ASA: 3  Anesthesia Plan: General   Post-op Pain Management: Minimal or no pain anticipated   Induction: Intravenous  PONV Risk Score and Plan: 2 and Propofol infusion, TIVA and Ondansetron  Airway Management Planned: Nasal Cannula  Additional Equipment: None  Intra-op Plan:   Post-operative Plan:   Informed Consent: I have reviewed the patients History and Physical, chart, labs and discussed the procedure including the risks, benefits and alternatives for the proposed anesthesia with the patient or  authorized representative who has indicated his/her understanding and acceptance.     Dental advisory given  Plan Discussed with: CRNA and Surgeon  Anesthesia Plan Comments: (Discussed risks of anesthesia with patient, including possibility of difficulty with spontaneous ventilation under anesthesia necessitating airway intervention, PONV, and rare risks such as cardiac or respiratory or neurological events, and allergic reactions. Discussed the role of CRNA in patient's perioperative care. Patient understands.)       Anesthesia Quick Evaluation

## 2022-05-28 NOTE — OR Nursing (Signed)
HEART RATE 147. DENIES ANY SX . MAP 86 CARDIZEM 12.MG IV P GIVEN,NS INFUSING. WILL F/U VS IN 15 MINUTES

## 2022-05-29 ENCOUNTER — Encounter: Payer: Self-pay | Admitting: Gastroenterology

## 2022-05-29 LAB — SURGICAL PATHOLOGY

## 2022-10-07 ENCOUNTER — Other Ambulatory Visit: Payer: Self-pay | Admitting: Student

## 2022-10-07 DIAGNOSIS — I480 Paroxysmal atrial fibrillation: Secondary | ICD-10-CM

## 2022-10-07 DIAGNOSIS — R9439 Abnormal result of other cardiovascular function study: Secondary | ICD-10-CM

## 2022-10-16 ENCOUNTER — Ambulatory Visit: Payer: BC Managed Care – PPO

## 2022-11-15 ENCOUNTER — Encounter: Payer: Self-pay | Admitting: Internal Medicine

## 2022-11-15 ENCOUNTER — Encounter
Admission: RE | Disposition: A | Discharge status: Home / Self Care | Payer: Self-pay | Source: Home / Self Care | Attending: Internal Medicine

## 2022-11-15 ENCOUNTER — Ambulatory Visit: Payer: BC Managed Care – PPO | Admitting: Registered Nurse

## 2022-11-15 ENCOUNTER — Ambulatory Visit
Admission: RE | Admit: 2022-11-15 | Discharge: 2022-11-15 | Disposition: A | Discharge status: Home / Self Care | Payer: BC Managed Care – PPO | Attending: Internal Medicine | Admitting: Internal Medicine

## 2022-11-15 ENCOUNTER — Other Ambulatory Visit: Payer: Self-pay

## 2022-11-15 DIAGNOSIS — I480 Paroxysmal atrial fibrillation: Secondary | ICD-10-CM | POA: Insufficient documentation

## 2022-11-15 DIAGNOSIS — Z7984 Long term (current) use of oral hypoglycemic drugs: Secondary | ICD-10-CM | POA: Insufficient documentation

## 2022-11-15 DIAGNOSIS — I4819 Other persistent atrial fibrillation: Secondary | ICD-10-CM

## 2022-11-15 DIAGNOSIS — I10 Essential (primary) hypertension: Secondary | ICD-10-CM | POA: Diagnosis not present

## 2022-11-15 DIAGNOSIS — Z79899 Other long term (current) drug therapy: Secondary | ICD-10-CM | POA: Insufficient documentation

## 2022-11-15 DIAGNOSIS — G4733 Obstructive sleep apnea (adult) (pediatric): Secondary | ICD-10-CM | POA: Diagnosis not present

## 2022-11-15 DIAGNOSIS — E7800 Pure hypercholesterolemia, unspecified: Secondary | ICD-10-CM | POA: Diagnosis not present

## 2022-11-15 DIAGNOSIS — Z7901 Long term (current) use of anticoagulants: Secondary | ICD-10-CM | POA: Diagnosis not present

## 2022-11-15 DIAGNOSIS — E119 Type 2 diabetes mellitus without complications: Secondary | ICD-10-CM | POA: Insufficient documentation

## 2022-11-15 HISTORY — PX: CARDIOVERSION: SHX1299

## 2022-11-15 LAB — GLUCOSE, CAPILLARY: Glucose-Capillary: 142 mg/dL — ABNORMAL HIGH (ref 70–99)

## 2022-11-15 SURGERY — CARDIOVERSION
Anesthesia: General

## 2022-11-15 MED ORDER — PROPOFOL 10 MG/ML IV BOLUS
INTRAVENOUS | Status: DC | PRN
  Administered 2022-11-15: 70 mg via INTRAVENOUS

## 2022-11-15 MED ORDER — SODIUM CHLORIDE 0.9 % IV SOLN: INTRAVENOUS | Status: DC

## 2022-11-15 MED ORDER — PROPOFOL 10 MG/ML IV BOLUS
INTRAVENOUS | Status: AC
  Filled 2022-11-15: qty 40

## 2022-11-15 NOTE — H&P (Signed)
Established Patient Visit   Chief Complaint: Chief Complaint  Patient presents with  Atrial Fibrillation  Date of Service: 10/04/2022 Date of Birth: Jun 21, 1965 PCP: Claybon Jabs, MD  History of Present Illness: Jason Soto is a 57 y.o.male patient with a past medical history of paroxysmal atrial fibrillation, OSA, dyslipidemia, hypertension, type 2 diabetes.   He was cardioverted on 02/07/2021 after an episode of atrial fibrillation. Patient was taken off of flecainide after abnormal stress test, was tried on Multaq although this was not affordable. Had been on amiodarone but was taken off of this at his last appointment for concerns of long term side effects and bradycardia. Remains on diltiazem, metoprolol. Was found to be back in atrial fibrillation at appointment on 09/27/2022 at rapid ventricular rate of 117 bpm. Was restarted on amiodarone 200 mg twice daily for rhythm control and heart rate control. He was also referred to electrophysiology and has appointment with Dr. Alden Hipp on 10/30/22 for consideration of ablation versus alternative antiarrhythmic therapy. Remains anticoagulated on Eliquis 5 mg twice daily. Currently denies any shortness of breath, chest pain, leg edema, weakness, syncope, presyncope. EKG in the office today shows better controlled ventricular rate of 77 bpm, atrial fibrillation.  Past Medical and Surgical History  Past Medical History Past Medical History:  Diagnosis Date  Asthma, unspecified asthma severity, unspecified whether complicated, unspecified whether persistent (HHS-HCC)  Very Rare Occasions  Atrial fibrillation (CMS/HHS-HCC)  COVID-19 03/2019  COVID-19 01/2020  Diabetes mellitus without complication (CMS/HHS-HCC)  Early Stage  Hyperlipidemia  Hypertension, essential  OSA (obstructive sleep apnea) 02/16/2021   Past Surgical History He has a past surgical history that includes Inguinal hernia repair (Bilateral) and Repair Umbilical Hernia.    Medications and Allergies  Current Medications  Current Outpatient Medications on File Prior to Visit  Medication Sig Dispense Refill  albuterol 90 mcg/actuation inhaler Inhale 2 inhalations into the lungs every 6 (six) hours as needed for Wheezing 8.5 g 12  AMIOdarone (PACERONE) 200 MG tablet Take 1 tablet (200 mg total) by mouth 2 (two) times daily 60 tablet 1  clobetasoL (TEMOVATE) 0.05 % ointment Apply 1 Application topically as needed  dilTIAZem (CARDIZEM CD) 120 MG XR capsule Take 1 capsule (120 mg total) by mouth once daily 90 capsule 4  ELIQUIS 5 mg tablet TAKE 1 TABLET(5 MG) BY MOUTH TWICE DAILY 60 tablet 11  flash glucose scanning (FREESTYLE LIBRE 14 DAY) reader Use 1 Device as directed 1 each 1  flash glucose scanning (FREESTYLE LIBRE 2 READER) reader Use 1 Device as directed 1 each 1  glipiZIDE (GLUCOTROL XL) 5 MG XL tablet Take 1 tablet (5 mg total) by mouth once daily 90 tablet 0  metFORMIN (GLUCOPHAGE-XR) 500 MG XR tablet TAKE 4 TABLETS(2000 MG) BY MOUTH DAILY AFTER BREAKFAST 360 tablet 2  metoprolol tartrate (LOPRESSOR) 100 MG tablet Take 1 tablet (100 mg total) by mouth 2 (two) times daily 180 tablet 3   No current facility-administered medications on file prior to visit.   Allergies: Aspirin and Penicillins  Social and Family History  Social History reports that he has never smoked. He has never used smokeless tobacco. He reports that he does not currently use alcohol. He reports that he does not use drugs.  Family History Family History  Problem Relation Name Age of Onset  Dementia Mother  deceased 32  Colon cancer Mother  COPD Father  smoker, deceased 64  Coronary Artery Disease (Blocked arteries around heart) Father  Atrial fibrillation (Abnormal heart rhythm  sometimes requiring treatment with blood thinners) Father  No Known Problems Brother  Atrial fibrillation (Abnormal heart rhythm sometimes requiring treatment with blood thinners) Brother  No Known  Problems Maternal Grandmother  No Known Problems Maternal Grandfather  No Known Problems Paternal Grandmother  No Known Problems Paternal Grandfather   Review of Systems   Review of Systems  Positive for palpitations Negative for weight gain weight loss, weakness, vision change, hearing loss, cough, congestion, PND, orthopnea, heartburn, nausea, diaphoresis, vomiting, diarrhea, bloody stool, melena, stomach pain, extremity pain, leg weakness, leg cramping, leg blood clots, headache, blackouts, nosebleed, trouble swallowing, mouth pain, urinary frequency, urination at night, muscle weakness, skin lesions, skin rashes, tingling ,ulcers, numbness, anxiety, and/or depression Physical Examination   Vitals:BP 100/70 (BP Location: Left upper arm, Patient Position: Sitting, BP Cuff Size: Large Adult)  Pulse 65  Resp 15  Ht 175.3 cm (5\' 9" )  Wt 93.2 kg (205 lb 6.4 oz)  SpO2 97%  BMI 30.33 kg/m  Ht:175.3 cm (5\' 9" ) Wt:93.2 kg (205 lb 6.4 oz) UEA:VWUJ surface area is 2.13 meters squared. Body mass index is 30.33 kg/m. Appearance: well appearing in no acute distress HEENT: Pupils equally reactive to light and accomodation, no xanthalasma  Neck: Supple, no apparent thyromegaly, masses, or lymphadenopathy  Lungs: normal respiratory effort; no crackles, no rhonchi, no wheezes Heart: Regular rate and rhythm. Normal S1 S2 No gallops, murmur, no rub, PMI is normal size and placement. carotid upstroke normal without bruit. Jugular venous pressure is normal Abdomen: soft, nontender, not distended with normal bowel sounds. No apparent hepatosplenomegally. Abdominal aorta is normal size without bruit Extremities: no edema, no ulcers, no clubbing, no cyanosis Peripheral Pulses: 2+ in upper extremities, 2+ femoral pulses bilaterally, 2+lower extremity  Musculoskeletal; Normal muscle tone without kyphosis Neurological: Oriented and Alert, Cranial nerves intact  Assessment   57 y.o. male with  Encounter  Diagnoses  Name Primary?  Paroxysmal A-fib (CMS/HHS-HCC) Yes  Cardiovascular stress test abnormal  Hypercholesterolemia  Hypertension, essential  Type 2 diabetes mellitus without complication, without long-term current use of insulin (CMS/HHS-HCC)   Plan   1. Paroxysmal atrial fibrillation: Back in atrial fibrillation with better ventricular control of 77 bpm after restarting amiodarone at last visit. Asymptomatic -Continue metoprolol 100 mg twice daily -Continue diltiazem 120 mg daily -Continue Eliquis 5 mg twice daily -Continue amiodarone 200 mg twice daily for two weeks then reduce to 200 mg once daily -Keep appt with EP, Dr. Alden Hipp on 10/30/22 -CT heart angiogram for evaluation of possible myocardial ischemia with history of abnormal nuclear imaging -Echocardiogram for evaluation of heart structure and function with worsening atrial fibrillation  2. Hypertension: Currently stable at 100/70 -Continue lisinopril, metoprolol, diltiazem with no change  3. Hyperlipidemia: Currently on atorvastatin 80 mg daily with no side effects. Last lipid panel on 03/26/22 showed an LDL of 150. Patient states that he was off of his atorvastatin for several weeks prior to this cholesterol panel. He has been back on his atorvastatin with no missed doses for the last several months. -Continue atorvastatin 80 mg daily  4. OSA: Diagnosed with OSA by sleep study. Currently using CPAP a few times per week. He is aware of the risks of untreated sleep apnea -Recommend patient use CPAP nightly for most benefit  Orders Placed This Encounter  Procedures  CT heart angiogram with FFRCT  ECG 12-lead  Echo complete

## 2022-11-15 NOTE — Anesthesia Preprocedure Evaluation (Signed)
Anesthesia Evaluation  Patient identified by MRN, date of birth, ID band Patient awake    Reviewed: Allergy & Precautions, H&P , NPO status , Patient's Chart, lab work & pertinent test results, reviewed documented beta blocker date and time   Airway Mallampati: II   Neck ROM: full    Dental  (+) Poor Dentition   Pulmonary neg pulmonary ROS   Pulmonary exam normal        Cardiovascular Exercise Tolerance: Poor hypertension, On Medications negative cardio ROS Normal cardiovascular exam Rhythm:regular Rate:Normal     Neuro/Psych negative neurological ROS  negative psych ROS   GI/Hepatic negative GI ROS, Neg liver ROS,,,  Endo/Other  negative endocrine ROSdiabetes, Well Controlled    Renal/GU negative Renal ROS  negative genitourinary   Musculoskeletal   Abdominal   Peds  Hematology negative hematology ROS (+)   Anesthesia Other Findings Past Medical History: No date: Atrial fibrillation (HCC) No date: Diabetes mellitus without complication (HCC) No date: Hypercholesteremia No date: Hypertension No date: Sleep apnea Past Surgical History: 02/07/2021: CARDIOVERSION; N/A     Comment:  Procedure: CARDIOVERSION;  Surgeon: Lamar Blinks,               MD;  Location: [facility] ORS;  Service: Cardiovascular;                Laterality: N/A; 05/28/2022: COLONOSCOPY WITH PROPOFOL; N/A     Comment:  Procedure: COLONOSCOPY WITH PROPOFOL;  Surgeon: Wyline Mood, MD;  Location: [facility] ENDOSCOPY;  Service:               Endoscopy;  Laterality: N/A; No date: HERNIA REPAIR BMI    Body Mass Index: 31.20 kg/m     Reproductive/Obstetrics negative OB ROS                             Anesthesia Physical Anesthesia Plan  ASA: 4 and emergent  Anesthesia Plan: General   Post-op Pain Management:    Induction:   PONV Risk Score and Plan:   Airway Management Planned:   Additional  Equipment:   Intra-op Plan:   Post-operative Plan:   Informed Consent: I have reviewed the patients History and Physical, chart, labs and discussed the procedure including the risks, benefits and alternatives for the proposed anesthesia with the patient or authorized representative who has indicated his/her understanding and acceptance.     Dental Advisory Given  Plan Discussed with: CRNA  Anesthesia Plan Comments:        Anesthesia Quick Evaluation

## 2022-11-15 NOTE — Anesthesia Postprocedure Evaluation (Signed)
Anesthesia Post Note  Patient: Leotha Kendall Flack  Procedure(s) Performed: CARDIOVERSION  Patient location during evaluation: PACU Anesthesia Type: General Level of consciousness: awake and alert Pain management: pain level controlled Vital Signs Assessment: post-procedure vital signs reviewed and stable Respiratory status: spontaneous breathing, nonlabored ventilation, respiratory function stable and patient connected to nasal cannula oxygen Cardiovascular status: blood pressure returned to baseline and stable Postop Assessment: no apparent nausea or vomiting Anesthetic complications: no   No notable events documented.   Last Vitals:  Vitals:   11/15/22 1215 11/15/22 1216  BP:  95/73  Pulse: (!) 55 (!) 54  Resp: 10 14  Temp:    SpO2: 96% 97%    Last Pain:  Vitals:   11/15/22 1122  TempSrc: Oral  PainSc: 0-No pain                 Yevette Edwards

## 2022-11-15 NOTE — CV Procedure (Signed)
Direct current cardioversion 11/15/2022 12:23 PM  Indication symptomatic A. Fibrillation.  Procedure: Using IV Propofol and IV Lidocaine (for reducing venous pain) for achieving deep sedation, synchronized direct current cardioversion performed. Patient was delivered with 150 Joules of electricity X 1 with success to NSR. Patient tolerated the procedure well. No immediate complication noted.    Jason Searing Hadar Elgersma, DO 12:23 PM 11/15/22

## 2022-11-15 NOTE — Transfer of Care (Signed)
Immediate Anesthesia Transfer of Care Note  Patient: Jason Soto  Procedure(s) Performed: CARDIOVERSION  Patient Location: PACU  Anesthesia Type:General  Level of Consciousness: drowsy  Airway & Oxygen Therapy: Patient Spontanous Breathing and Patient connected to nasal cannula oxygen  Post-op Assessment: Report given to RN and Post -op Vital signs reviewed and stable  Post vital signs: Reviewed and stable  Last Vitals:  Vitals Value Taken Time  BP 95/73 11/15/22 1216  Temp    Pulse 54 11/15/22 1216  Resp 14 11/15/22 1216  SpO2 97 % 11/15/22 1216    Last Pain:  Vitals:   11/15/22 1122  TempSrc: Oral  PainSc: 0-No pain         Complications: No notable events documented.

## 2022-11-16 ENCOUNTER — Encounter: Payer: Self-pay | Admitting: Internal Medicine

## 2022-11-17 NOTE — Plan of Care (Signed)
[facility] Tonsillectomy/Adenoidectomy, Postoperative PEDS care plan entered in error.

## 2022-12-02 ENCOUNTER — Other Ambulatory Visit: Payer: Self-pay | Admitting: Student

## 2022-12-02 DIAGNOSIS — R9439 Abnormal result of other cardiovascular function study: Secondary | ICD-10-CM

## 2022-12-03 ENCOUNTER — Encounter (HOSPITAL_COMMUNITY): Payer: Self-pay

## 2022-12-03 ENCOUNTER — Telehealth (HOSPITAL_COMMUNITY): Payer: Self-pay | Admitting: *Deleted

## 2022-12-03 NOTE — Telephone Encounter (Signed)
Reaching out to patient to offer assistance regarding upcoming cardiac imaging study; pt verbalizes understanding of appt date/time, parking situation and where to check in, pre-test NPO status and medications ordered, and verified current allergies; name and call back number provided for further questions should they arise Hayley Sharpe RN Navigator Cardiac Imaging [facility] [phone] office [phone] cell  

## 2022-12-04 ENCOUNTER — Ambulatory Visit
Admission: RE | Admit: 2022-12-04 | Discharge: 2022-12-04 | Disposition: A | Discharge status: Home / Self Care | Payer: BC Managed Care – PPO | Source: Ambulatory Visit | Attending: Student | Admitting: Student

## 2022-12-04 DIAGNOSIS — R9439 Abnormal result of other cardiovascular function study: Secondary | ICD-10-CM | POA: Insufficient documentation

## 2022-12-04 LAB — POCT I-STAT CREATININE: Creatinine, Ser: 1.2 mg/dL (ref 0.61–1.24)

## 2022-12-04 MED ORDER — NITROGLYCERIN 0.4 MG SL SUBL
0.8000 mg | SUBLINGUAL_TABLET | Freq: Once | SUBLINGUAL | Status: AC
  Administered 2022-12-04: 0.8 mg via SUBLINGUAL
  Filled 2022-12-04: qty 25

## 2022-12-04 MED ORDER — IOHEXOL 350 MG/ML SOLN
80.0000 mL | Freq: Once | INTRAVENOUS | Status: AC | PRN
  Administered 2022-12-04: 80 mL via INTRAVENOUS

## 2022-12-04 NOTE — Progress Notes (Signed)
Pt tolerated procedure well with no issues. Pt ABCs intact. Pt denies any complaints. Pt encouraged to drink plenty of water throughout the day. Pt ambulatory with steady gait.
